# Patient Record
Sex: Male | Born: 2000 | Hispanic: No | Marital: Single | Smoking: Never smoker
Health system: Southern US, Community
[De-identification: ages and names within clinical notes are randomized; demographics above are authoritative.]

---

## 2018-09-29 ENCOUNTER — Encounter (HOSPITAL_COMMUNITY): Payer: Self-pay

## 2018-09-29 ENCOUNTER — Ambulatory Visit (INDEPENDENT_AMBULATORY_CARE_PROVIDER_SITE_OTHER): Payer: Self-pay

## 2018-09-29 ENCOUNTER — Ambulatory Visit (HOSPITAL_COMMUNITY)
Admission: EM | Admit: 2018-09-29 | Discharge: 2018-09-29 | Disposition: A | Discharge status: Home / Self Care | Payer: Self-pay | Attending: Family Medicine | Admitting: Family Medicine

## 2018-09-29 DIAGNOSIS — R0789 Other chest pain: Secondary | ICD-10-CM | POA: Insufficient documentation

## 2018-09-29 DIAGNOSIS — S20212A Contusion of left front wall of thorax, initial encounter: Secondary | ICD-10-CM | POA: Insufficient documentation

## 2018-09-29 DIAGNOSIS — R52 Pain, unspecified: Secondary | ICD-10-CM | POA: Insufficient documentation

## 2018-09-29 DIAGNOSIS — S40022A Contusion of left upper arm, initial encounter: Secondary | ICD-10-CM | POA: Insufficient documentation

## 2018-09-29 MED ORDER — IBUPROFEN 800 MG PO TABS: 800.0000 mg | ORAL_TABLET | Freq: Three times a day (TID) | ORAL | 0 refills | Status: DC

## 2018-09-29 MED ORDER — HYDROCODONE-ACETAMINOPHEN 5-325 MG PO TABS
ORAL_TABLET | ORAL | Status: AC
  Filled 2018-09-29: qty 1

## 2018-09-29 MED ORDER — HYDROCODONE-ACETAMINOPHEN 7.5-325 MG PO TABS: 1.0000 | ORAL_TABLET | Freq: Four times a day (QID) | ORAL | 0 refills | Status: AC | PRN

## 2018-09-29 MED ORDER — HYDROCODONE-ACETAMINOPHEN 5-325 MG PO TABS
1.0000 | ORAL_TABLET | Freq: Once | ORAL | Status: AC
  Administered 2018-09-29: 1 via ORAL

## 2018-09-29 NOTE — Discharge Instructions (Addendum)
Rest Use ice to painful areas Take ibuprofen 3 x a day with food If pain continues severe, may add a hydrocodone in addition Do not drive on hydrocodone Expect improvement over a couple of weeks

## 2018-09-29 NOTE — ED Provider Notes (Signed)
[facility]    CSN: [id number] Arrival date & time: 09/29/18  1347     History   Chief Complaint Chief Complaint  Patient presents with  . Fall    HPI James Callahan is a 17 y.o. male.   HPI  Larey SeatFell off of a roof onto the left ribs and left arm with acute pain.  Feels unable to take a deep breath.  Hurts in the left lower ribs below the left breast.  No bruising.  No bleeding.  No hemoptysis.   Not hit his head.  No loss of consciousness. Denies any pain in her neck and back.  History reviewed. No pertinent past medical history.  There are no active problems to display for this patient.   History reviewed. No pertinent surgical history.     Home Medications    Prior to Admission medications   Medication Sig Start Date End Date Taking? Authorizing Provider  HYDROcodone-acetaminophen (NORCO) 7.5-325 MG tablet Take 1 tablet by mouth every 6 (six) hours as needed for moderate pain. 09/29/18   Eustace MooreNelson, Jackelynn Hosie Sue, MD  ibuprofen (ADVIL,MOTRIN) 800 MG tablet Take 1 tablet (800 mg total) by mouth 3 (three) times daily. 09/29/18   Eustace MooreNelson, Adyn Serna Sue, MD    Family History Family History  Problem Relation Age of Onset  . Healthy Mother   . Heart attack Father     Social History Social History   Tobacco Use  . Smoking status: Not on file  Substance Use Topics  . Alcohol use: Not on file  . Drug use: Not on file     Allergies   Patient has no known allergies.   Review of Systems Review of Systems  Constitutional: Negative for chills and fever.  HENT: Negative for ear pain and sore throat.   Eyes: Negative for pain and visual disturbance.  Respiratory: Positive for shortness of breath. Negative for cough.   Cardiovascular: Positive for chest pain. Negative for palpitations.  Gastrointestinal: Negative for abdominal pain and vomiting.  Genitourinary: Negative for dysuria and hematuria.  Musculoskeletal: Positive for arthralgias. Negative for back  pain.  Skin: Negative for color change and rash.  Neurological: Negative for seizures and syncope.  All other systems reviewed and are negative.    Physical Exam Triage Vital Signs ED Triage Vitals  Enc Vitals Group     BP 09/29/18 1412 (!) 144/78     Pulse Rate 09/29/18 1412 78     Resp 09/29/18 1412 20     Temp 09/29/18 1412 98.7 F (37.1 C)     Temp src --      SpO2 09/29/18 1412 99 %     Weight --      Height --      Head Circumference --      Peak Flow --      Pain Score 09/29/18 1413 8     Pain Loc --      Pain Edu? --      Excl. in GC? --    No data found.  Updated Vital Signs BP (!) 144/78   Pulse 78   Temp 98.7 F (37.1 C)   Resp 20   SpO2 99%      Physical Exam Constitutional:      General: He is in acute distress.     Appearance: He is well-developed and normal weight.     Comments: In obvious discomfort  HENT:     Head: Normocephalic and atraumatic.  Right Ear: Tympanic membrane and ear canal normal.     Left Ear: Tympanic membrane and ear canal normal.     Mouth/Throat:     Mouth: Mucous membranes are moist.  Eyes:     Conjunctiva/sclera: Conjunctivae normal.     Pupils: Pupils are equal, round, and reactive to light.  Neck:     Musculoskeletal: Normal range of motion and neck supple.  Cardiovascular:     Rate and Rhythm: Normal rate and regular rhythm.     Heart sounds: Normal heart sounds.  Pulmonary:     Effort: Pulmonary effort is normal. No respiratory distress.     Breath sounds: Normal breath sounds.  Chest:     Chest wall: Tenderness present.    Abdominal:     General: Abdomen is flat. There is no distension.     Palpations: Abdomen is soft.  Musculoskeletal: Normal range of motion.        General: Tenderness present.       Arms:  Skin:    General: Skin is warm and dry.  Neurological:     General: No focal deficit present.     Mental Status: He is alert. Mental status is at baseline.  Psychiatric:        Mood and  Affect: Mood normal.        Thought Content: Thought content normal.      UC Treatments / Results  Labs (all labs ordered are listed, but only abnormal results are displayed) Labs Reviewed - No data to display  EKG None  Radiology Dg Ribs Unilateral W/chest Left  Result Date: 09/29/2018 CLINICAL DATA:  Pain following fall EXAM: LEFT RIBS AND CHEST - 3+ VIEW COMPARISON:  None. FINDINGS: Frontal chest as well as oblique and cone-down rib images were obtained. Lungs are clear. Heart size and pulmonary vascularity are normal. No adenopathy. No rib fracture evident.  No pneumothorax or pleural effusion. IMPRESSION: No evident rib fracture.  No pneumothorax.  Lungs clear. Electronically Signed   By: Bretta Bang III M.D.   On: 09/29/2018 14:42   Dg Forearm Left  Result Date: 09/29/2018 CLINICAL DATA:  Left arm pain after falling off of a roof. Initial encounter. EXAM: LEFT FOREARM - 2 VIEW COMPARISON:  None. FINDINGS: There is no evidence of fracture or other focal bone lesions. Soft tissues are unremarkable. IMPRESSION: Negative. Electronically Signed   By: Sebastian Ache M.D.   On: 09/29/2018 15:02    Procedures Procedures (including critical care time)  Medications Ordered in UC Medications  HYDROcodone-acetaminophen (NORCO/VICODIN) 5-325 MG per tablet 1 tablet (1 tablet Oral Given 09/29/18 1417)    Initial Impression / Assessment and Plan / UC Course  I have reviewed the triage vital signs and the nursing notes.  Pertinent labs & imaging results that were available during my care of the patient were reviewed by me and considered in my medical decision making (see chart for details).     No fractures.  Discussed contusions.  Discussed medications.  We talked about deep breathing exercises to prevent pneumonia.  Follow-up with PCP.  Come here if worse instead of better at any time Final Clinical Impressions(s) / UC Diagnoses   Final diagnoses:  Pain  Contusion of left  arm, initial encounter  Contusion of ribs, left, initial encounter  Acute chest wall pain     Discharge Instructions     Rest Use ice to painful areas Take ibuprofen 3 x a day with food If pain  continues severe, may add a hydrocodone in addition Do not drive on hydrocodone Expect improvement over a couple of weeks    ED Prescriptions    Medication Sig Dispense Auth. Provider   HYDROcodone-acetaminophen (NORCO) 7.5-325 MG tablet Take 1 tablet by mouth every 6 (six) hours as needed for moderate pain. 15 tablet Eustace MooreNelson, Shalin Linders Sue, MD   ibuprofen (ADVIL,MOTRIN) 800 MG tablet Take 1 tablet (800 mg total) by mouth 3 (three) times daily. 21 tablet Eustace MooreNelson, Malajah Oceguera Sue, MD     Controlled Substance Prescriptions [place] Controlled Substance Registry consulted? Yes, I have consulted the [place] Controlled Substances Registry for this patient, and feel the risk/benefit ratio today is favorable for proceeding with this prescription for a controlled substance.   Eustace MooreNelson, Shavy Beachem Sue, MD 09/29/18 [phone]

## 2018-09-29 NOTE — ED Triage Notes (Signed)
Pt presents with complaints of falling off a roof 7-8 feet. Denies head trauma or LOC. Complains of pain on the left rib cage and in his left arm. Dr. Delton SeeNelson at bedside in triage to assess patient. Patient reports pain with deep breath and feeling like he cant take a deep breath.

## 2018-09-29 NOTE — ED Notes (Signed)
Arm Sling provided by Orland Jarredheres Staley, CMA

## 2020-04-29 IMAGING — DX DG FOREARM 2V*L*
3 series · 3 of 3 positions shown · non-contrast
Comparison: None.

CLINICAL DATA: Left arm pain after falling off of a roof. Initial
encounter.

EXAM:
LEFT FOREARM - 2 VIEW

[forearm ap]
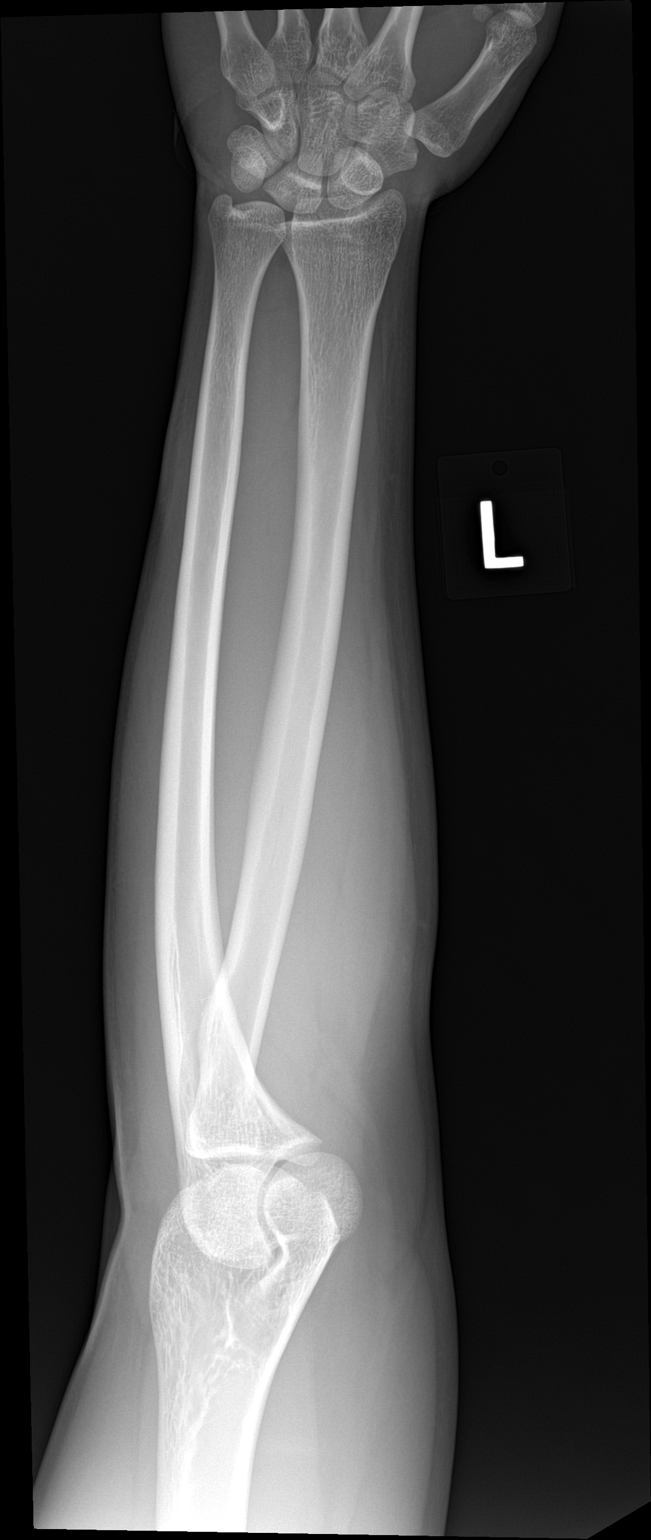

[forearm lat (1 of 2)]
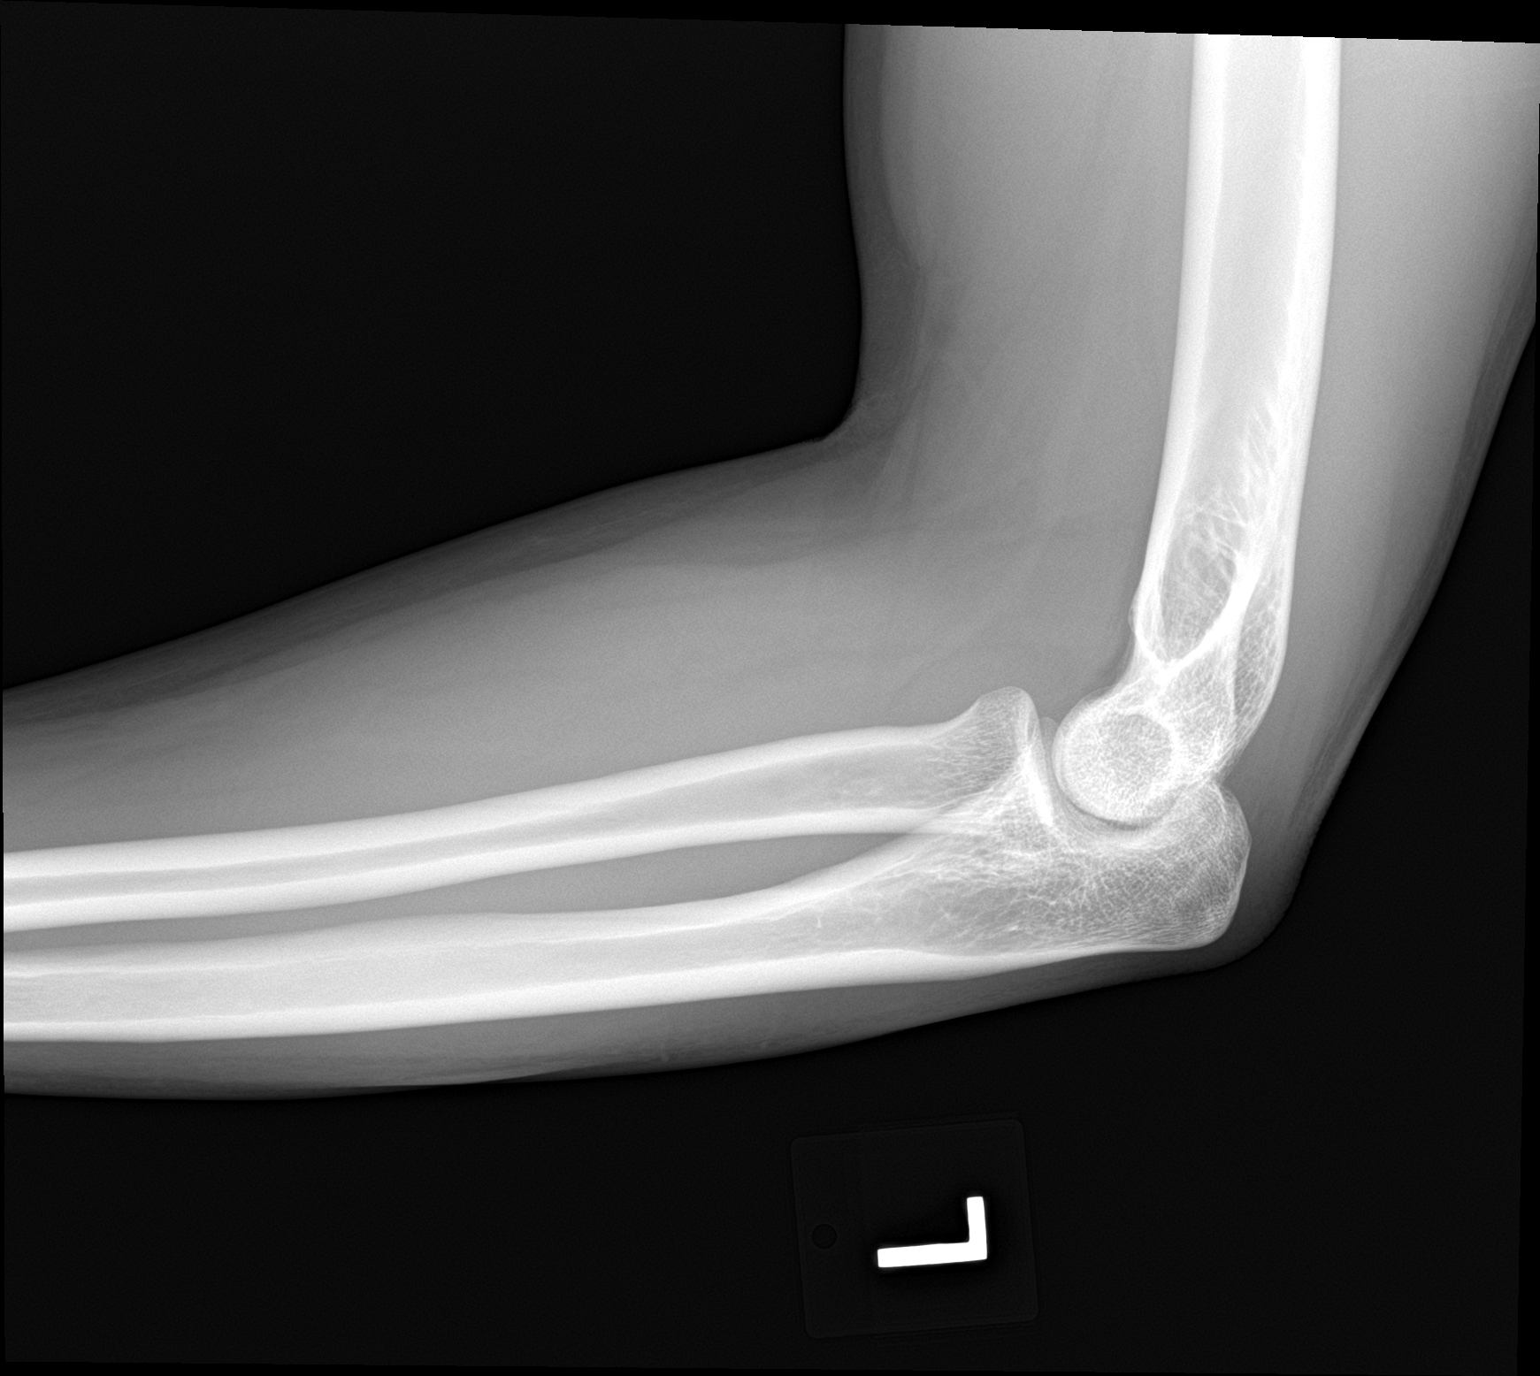

[forearm lat (2 of 2)]
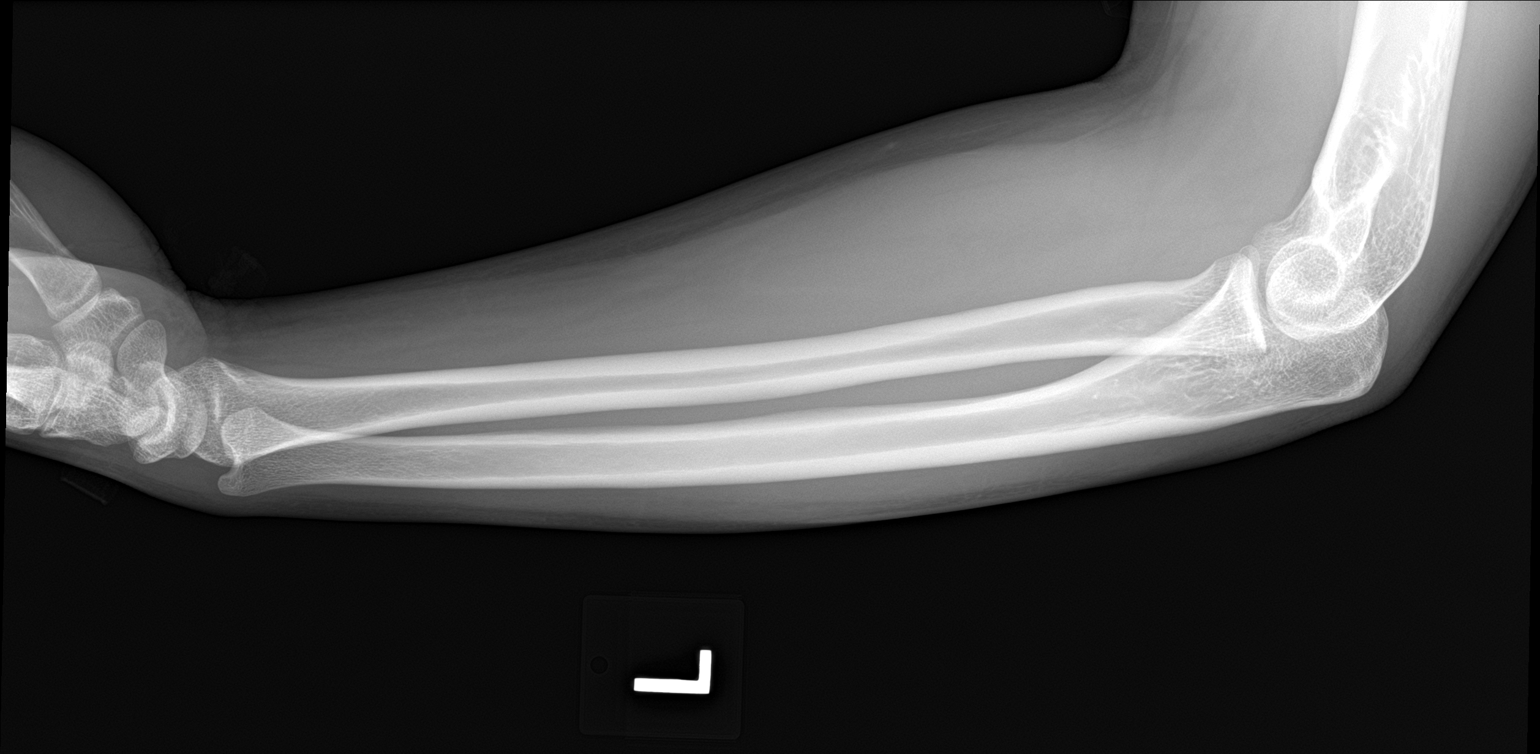

[3 of 3 positions shown; findings below may reference images not displayed]

FINDINGS: There is no evidence of fracture or other focal bone lesions. Soft
tissues are unremarkable.
IMPRESSION: Negative.

## 2020-04-29 IMAGING — DX DG RIBS W/ CHEST 3+V*L*
3 series · 3 of 3 positions shown · non-contrast
Comparison: None.

CLINICAL DATA: Pain following fall

EXAM:
LEFT RIBS AND CHEST - 3+ VIEW

[chest pa]
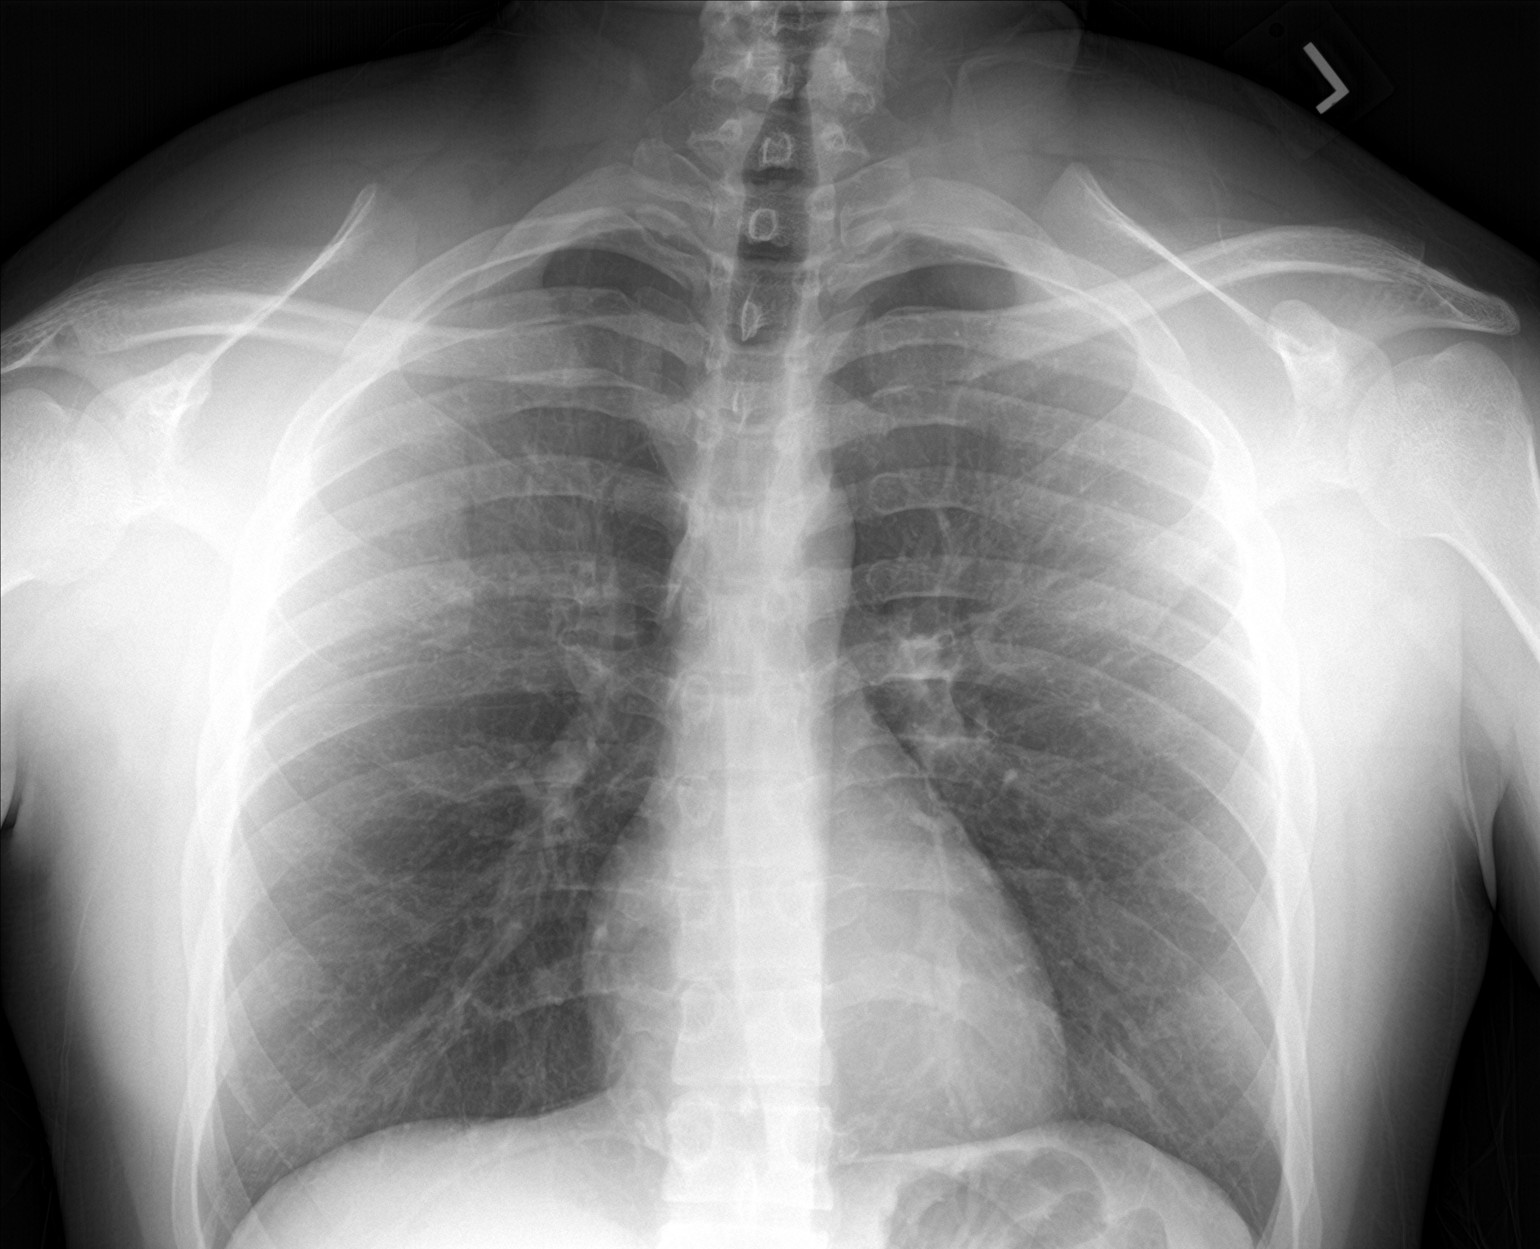

[rib obl]
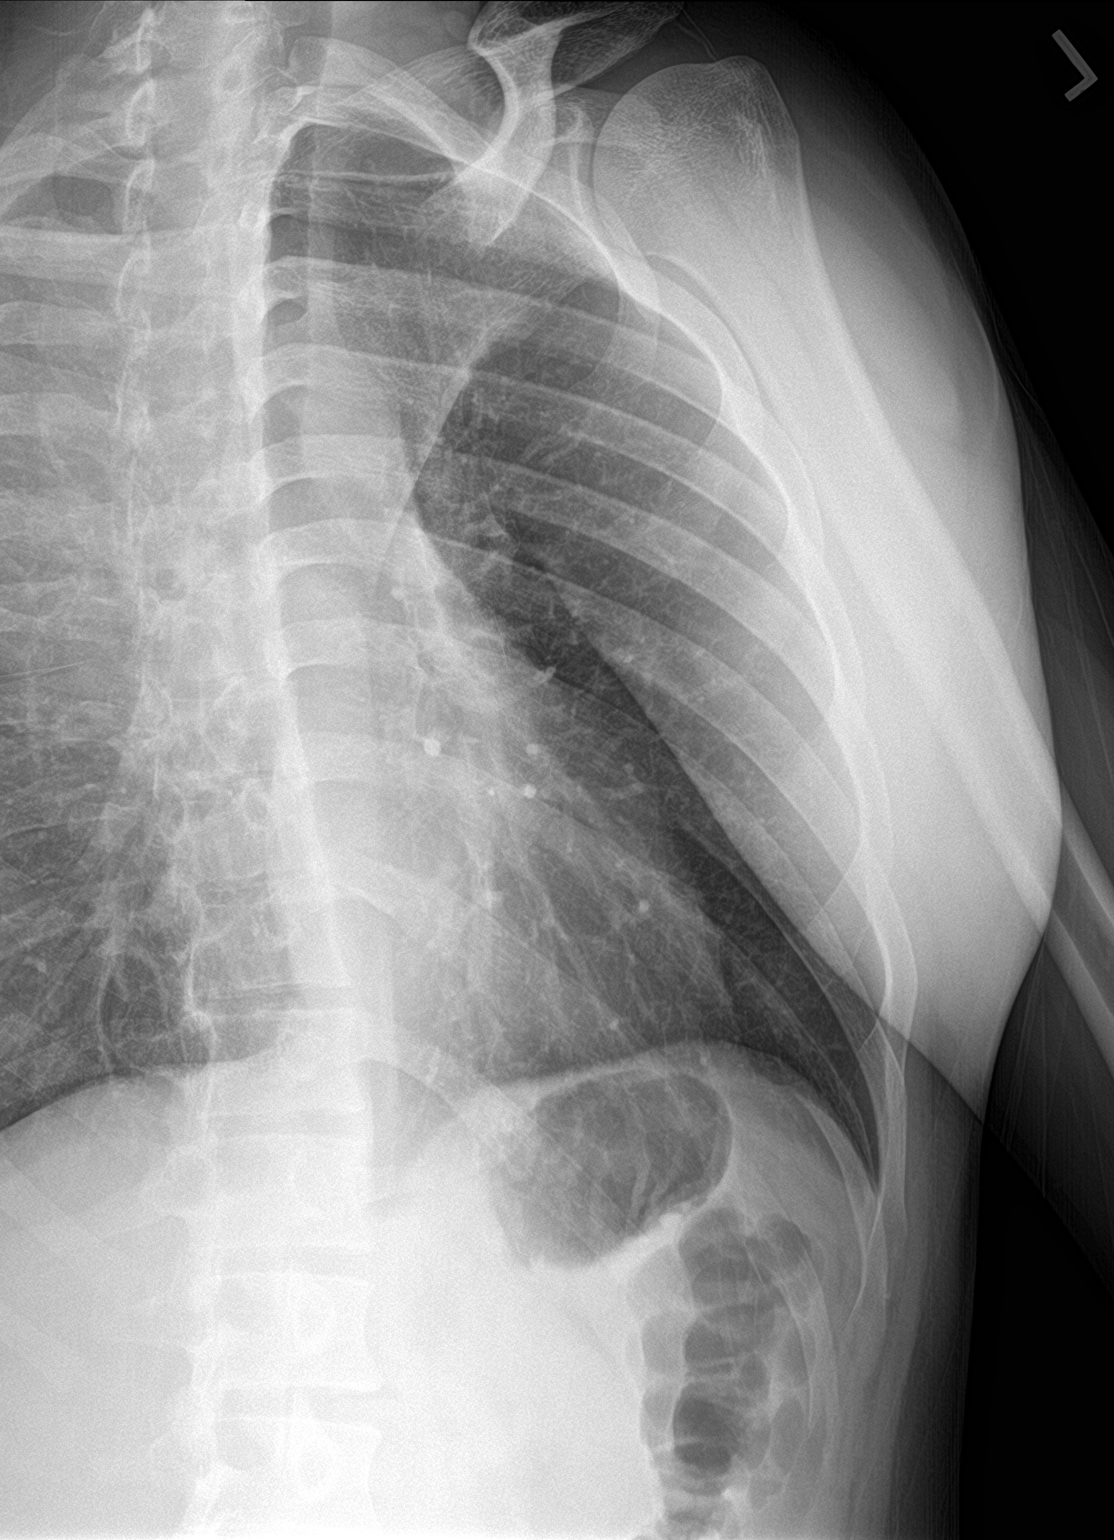

[rib pa]
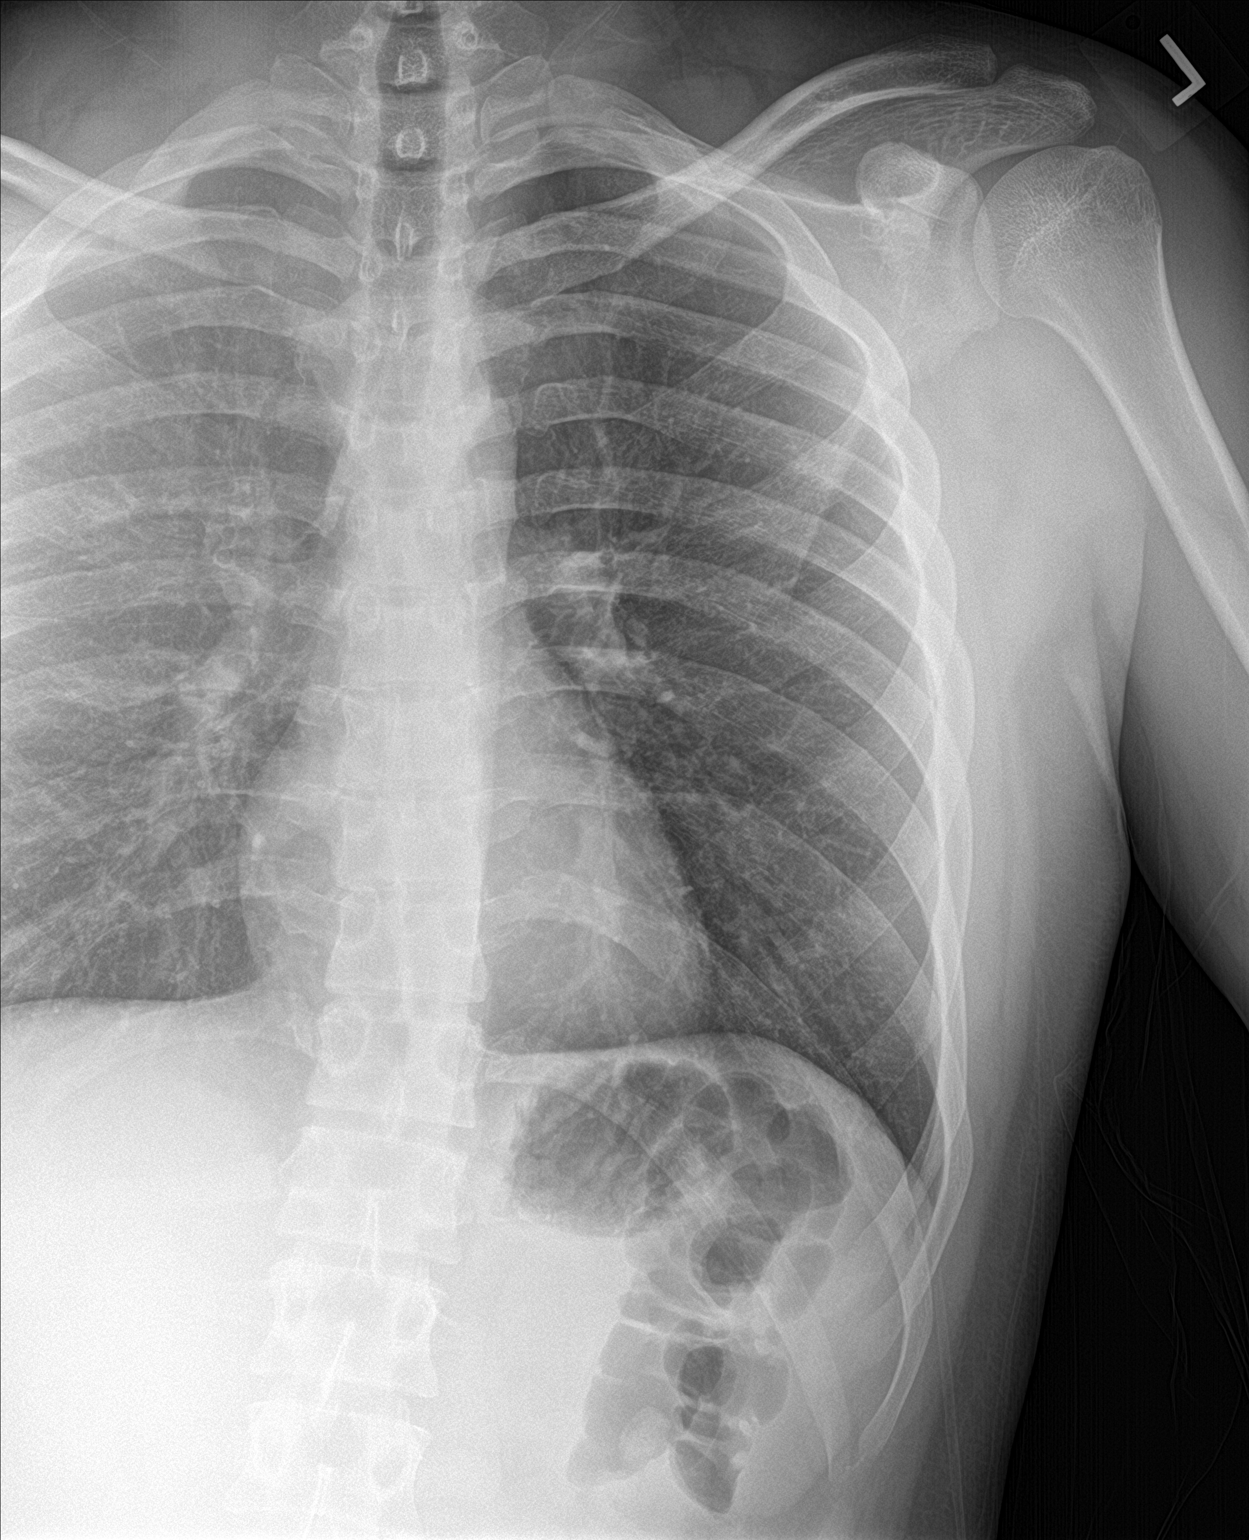

[3 of 3 positions shown; findings below may reference images not displayed]

FINDINGS: Frontal chest as well as oblique and cone-down rib images were
obtained. Lungs are clear. Heart size and pulmonary vascularity are
normal. No adenopathy.

No rib fracture evident.  No pneumothorax or pleural effusion.
IMPRESSION: No evident rib fracture.  No pneumothorax.  Lungs clear.

## 2023-10-05 ENCOUNTER — Emergency Department
Admission: EM | Admit: 2023-10-05 | Discharge: 2023-10-05 | Discharge status: Against Medical Advice | Payer: Medicaid Other | Attending: Emergency Medicine | Admitting: Emergency Medicine

## 2023-10-05 ENCOUNTER — Other Ambulatory Visit: Payer: Self-pay

## 2023-10-05 DIAGNOSIS — Z5321 Procedure and treatment not carried out due to patient leaving prior to being seen by health care provider: Secondary | ICD-10-CM | POA: Insufficient documentation

## 2023-10-05 DIAGNOSIS — R519 Headache, unspecified: Secondary | ICD-10-CM | POA: Insufficient documentation

## 2023-10-05 NOTE — ED Triage Notes (Signed)
Pt sts that he has been having a headache since 2100 last night. Pt sts that he has felt the pain in the left side of his head since then and nothing is helping the pain.

## 2023-10-05 NOTE — ED Notes (Signed)
Pt called for third time. Pt not in lobby, outside nor restroom. Pt has now been called 3 times and no answer. Pt taken off board due to this.

## 2024-02-20 ENCOUNTER — Emergency Department: Admission: EM | Admit: 2024-02-20 | Discharge: 2024-02-20 | Disposition: A | Discharge status: Home / Self Care

## 2024-02-20 ENCOUNTER — Other Ambulatory Visit: Payer: Self-pay

## 2024-02-20 DIAGNOSIS — J029 Acute pharyngitis, unspecified: Secondary | ICD-10-CM | POA: Insufficient documentation

## 2024-02-20 DIAGNOSIS — R059 Cough, unspecified: Secondary | ICD-10-CM | POA: Diagnosis not present

## 2024-02-20 LAB — RESP PANEL BY RT-PCR (RSV, FLU A&B, COVID)  RVPGX2
Influenza A by PCR: NEGATIVE
Influenza B by PCR: NEGATIVE
Resp Syncytial Virus by PCR: NEGATIVE
SARS Coronavirus 2 by RT PCR: NEGATIVE

## 2024-02-20 LAB — GROUP A STREP BY PCR: Group A Strep by PCR: NOT DETECTED

## 2024-02-20 MED ORDER — ACETAMINOPHEN 500 MG PO TABS
1000.0000 mg | ORAL_TABLET | Freq: Once | ORAL | Status: AC
  Administered 2024-02-20: 1000 mg via ORAL
  Filled 2024-02-20: qty 2

## 2024-02-20 MED ORDER — IBUPROFEN 800 MG PO TABS
800.0000 mg | ORAL_TABLET | Freq: Once | ORAL | Status: AC
  Administered 2024-02-20: 800 mg via ORAL
  Filled 2024-02-20: qty 1

## 2024-02-20 MED ORDER — DEXAMETHASONE 4 MG PO TABS
10.0000 mg | ORAL_TABLET | Freq: Once | ORAL | Status: AC
  Administered 2024-02-20: 10 mg via ORAL
  Filled 2024-02-20: qty 3

## 2024-02-20 MED ORDER — LIDOCAINE VISCOUS HCL 2 % MT SOLN
15.0000 mL | Freq: Once | OROMUCOSAL | Status: AC
  Administered 2024-02-20: 15 mL via OROMUCOSAL
  Filled 2024-02-20: qty 15

## 2024-02-20 MED ORDER — AMOXICILLIN-POT CLAVULANATE 875-125 MG PO TABS: 1.0000 | ORAL_TABLET | Freq: Two times a day (BID) | ORAL | 0 refills | Status: AC

## 2024-02-20 MED ORDER — IBUPROFEN 800 MG PO TABS: 800.0000 mg | ORAL_TABLET | Freq: Three times a day (TID) | ORAL | 0 refills | Status: AC | PRN

## 2024-02-20 NOTE — ED Provider Notes (Signed)
 [facility] Provider Note    Event Date/Time   First MD Initiated Contact with Patient 02/20/24 1408     (approximate)   History   Sore Throat and Cough  Pt to ED for sore throat since 1 week. States inside of throat is swollen, painful to swallow. Also dry mouth, dry cough and congestion. States "it feels like I have a piece of glass or metal stuck in my throat". Pt in NAD, has wet cough.    HPI James Callahan is a 23 y.o. male no significant PMH presents for evaluation of sore throat - Present for 1 week, worsening, refractory to Tylenol  -Tolerating p.o., no voice change -No fever, cough. + Nasal congestion.     Physical Exam   Triage Vital Signs: ED Triage Vitals  Encounter Vitals Group     BP 02/20/24 1401 (!) 113/92     Systolic BP Percentile --      Diastolic BP Percentile --      Pulse Rate 02/20/24 1401 (!) 102     Resp 02/20/24 1401 20     Temp 02/20/24 1401 98.6 F (37 C)     Temp Source 02/20/24 1401 Oral     SpO2 02/20/24 1401 98 %     Weight 02/20/24 1357 265 lb (120.2 kg)     Height 02/20/24 1357 5\' 10"  (1.778 m)     Head Circumference --      Peak Flow --      Pain Score 02/20/24 1357 8     Pain Loc --      Pain Education --      Exclude from Growth Chart --     Most recent vital signs: Vitals:   02/20/24 1401  BP: (!) 113/92  Pulse: (!) 102  Resp: 20  Temp: 98.6 F (37 C)  SpO2: 98%     General: Awake, no distress.  Well-appearing. HEENT: + Bilateral tonsillar erythema and mild swelling, no exudate.  No uvular deviation.  No trismus or pooling of secretions.  Mild bilateral submandibular lymphadenopathy. CV:  Good peripheral perfusion. Resp:  Normal effort.  Abd:  No distention. Nontender to deep palpation throughout   ED Results / Procedures / Treatments   Labs (all labs ordered are listed, but only abnormal results are displayed) Labs Reviewed  GROUP A STREP BY PCR  RESP PANEL BY RT-PCR (RSV, FLU  A&B, COVID)  [id number]     EKG  N/a   RADIOLOGY N/a   PROCEDURES:  Critical Care performed: No  Procedures   MEDICATIONS ORDERED IN ED: Medications  acetaminophen  (TYLENOL ) tablet 1,000 mg (has no administration in time range)  ibuprofen  (ADVIL ) tablet 800 mg (has no administration in time range)  dexamethasone (DECADRON) tablet 10 mg (has no administration in time range)  lidocaine (XYLOCAINE) 2 % viscous mouth solution 15 mL (has no administration in time range)     IMPRESSION / MDM / ASSESSMENT AND PLAN / ED COURSE  I reviewed the triage vital signs and the nursing notes.                              DDX/MDM/AP: Differential diagnosis includes, but is not limited to, pharyngitis, consider viral versus bacterial though given timeframe of symptoms and worsening I am inclined to treat this as a bacterial pharyngitis.  No evidence of deep neck space infection at this time nor PTA.  Plan for pain  control, Augmentin.  Plan: - Tylenol , Motrin , viscous lidocaine, Decadron - Augmentin - Viral swab and strep swab collected at triage, can follow-up as outpatient--treating empirically with antibiotics  Patient's presentation is most consistent with acute, uncomplicated illness.        FINAL CLINICAL IMPRESSION(S) / ED DIAGNOSES   Final diagnoses:  Acute pharyngitis, unspecified etiology     Rx / DC Orders   ED Discharge Orders          Ordered    ibuprofen  (ADVIL ) 800 MG tablet  Every 8 hours PRN        02/20/24 1431    amoxicillin-clavulanate (AUGMENTIN) 875-125 MG tablet  2 times daily        02/20/24 1431             Note:  This document was prepared using Dragon voice recognition software and may include unintentional dictation errors.   Collis Deaner, MD 02/20/24 [phone]

## 2024-02-20 NOTE — Discharge Instructions (Addendum)
 Your evaluation in the emergency department was overall reassuring.  You may be developing a bacterial infection of your throat, and I have started you on an antibiotic to treat this.  You can continue to use Tylenol , and have also prescribed you Motrin  to help with any ongoing pain.  Please follow-up with your primary care doctor as needed for any ongoing symptoms, and return to the emergency department with any new or worsening symptoms.

## 2024-02-20 NOTE — ED Triage Notes (Signed)
 Pt to ED for sore throat since 1 week. States inside of throat is swollen, painful to swallow. Also dry mouth, dry cough and congestion. States "it feels like I have a piece of glass or metal stuck in my throat". Pt in NAD, has wet cough.

## 2024-07-20 ENCOUNTER — Emergency Department

## 2024-07-20 ENCOUNTER — Emergency Department
Admission: EM | Admit: 2024-07-20 | Discharge: 2024-07-20 | Disposition: A | Discharge status: Home / Self Care | Attending: Emergency Medicine | Admitting: Emergency Medicine

## 2024-07-20 ENCOUNTER — Other Ambulatory Visit: Payer: Self-pay

## 2024-07-20 ENCOUNTER — Encounter: Payer: Self-pay | Admitting: Emergency Medicine

## 2024-07-20 DIAGNOSIS — W19XXXA Unspecified fall, initial encounter: Secondary | ICD-10-CM | POA: Insufficient documentation

## 2024-07-20 DIAGNOSIS — S9002XA Contusion of left ankle, initial encounter: Secondary | ICD-10-CM | POA: Insufficient documentation

## 2024-07-20 DIAGNOSIS — S99912A Unspecified injury of left ankle, initial encounter: Secondary | ICD-10-CM

## 2024-07-20 LAB — CBC WITH DIFFERENTIAL/PLATELET
Abs Immature Granulocytes: 0.05 K/uL (ref 0.00–0.07)
Basophils Absolute: 0.1 K/uL (ref 0.0–0.1)
Basophils Relative: 1 %
Eosinophils Absolute: 0.4 K/uL (ref 0.0–0.5)
Eosinophils Relative: 4 %
HCT: 44.1 % (ref 39.0–52.0)
Hemoglobin: 15.1 g/dL (ref 13.0–17.0)
Immature Granulocytes: 1 %
Lymphocytes Relative: 28 %
Lymphs Abs: 2.6 K/uL (ref 0.7–4.0)
MCH: 30.4 pg (ref 26.0–34.0)
MCHC: 34.2 g/dL (ref 30.0–36.0)
MCV: 88.7 fL (ref 80.0–100.0)
Monocytes Absolute: 0.6 K/uL (ref 0.1–1.0)
Monocytes Relative: 6 %
Neutro Abs: 5.6 K/uL (ref 1.7–7.7)
Neutrophils Relative %: 60 %
Platelets: 277 K/uL (ref 150–400)
RBC: 4.97 MIL/uL (ref 4.22–5.81)
RDW: 12.2 % (ref 11.5–15.5)
WBC: 9.3 K/uL (ref 4.0–10.5)
nRBC: 0 % (ref 0.0–0.2)

## 2024-07-20 LAB — BASIC METABOLIC PANEL WITH GFR
Anion gap: 9 (ref 5–15)
BUN: 6 mg/dL (ref 6–20)
CO2: 26 mmol/L (ref 22–32)
Calcium: 9.1 mg/dL (ref 8.9–10.3)
Chloride: 103 mmol/L (ref 98–111)
Creatinine, Ser: 0.95 mg/dL (ref 0.61–1.24)
GFR, Estimated: >60 mL/min (ref >60)
Glucose, Bld: 110 mg/dL — ABNORMAL HIGH (ref 70–99)
Potassium: 3.9 mmol/L (ref 3.5–5.1)
Sodium: 138 mmol/L (ref 135–145)

## 2024-07-20 MED ORDER — KETOROLAC TROMETHAMINE 15 MG/ML IJ SOLN
15.0000 mg | Freq: Once | INTRAMUSCULAR | Status: AC
  Administered 2024-07-20: 15 mg via INTRAMUSCULAR
  Filled 2024-07-20: qty 1

## 2024-07-20 MED ORDER — ACETAMINOPHEN 325 MG PO TABS
650.0000 mg | ORAL_TABLET | Freq: Once | ORAL | Status: DC
  Filled 2024-07-20 (×2): qty 2

## 2024-07-20 NOTE — ED Notes (Signed)
 Patient declined discharge vital signs.

## 2024-07-20 NOTE — ED Provider Notes (Signed)
 [facility] Provider Note    None    (approximate)   History   Leg Pain   HPI  James Callahan is a 23 y.o. male who presents today for evaluation of left ankle pain.  Patient reports that he noticed bruising to both sides of his ankle prompting him to come in for evaluation.  He denies paresthesias or weakness.  He is uncertain of any particular injury, but does report that he fell through the vent a few days ago.  He is able to ambulate.  There are no active problems to display for this patient.         Physical Exam   Triage Vital Signs: ED Triage Vitals  Encounter Vitals Group     BP 07/20/24 0912 (!) 150/86     Girls Systolic BP Percentile --      Girls Diastolic BP Percentile --      Boys Systolic BP Percentile --      Boys Diastolic BP Percentile --      Pulse Rate 07/20/24 0912 (!) 102     Resp 07/20/24 0912 16     Temp 07/20/24 0912 98.6 F (37 C)     Temp Source 07/20/24 0912 Oral     SpO2 07/20/24 0912 96 %     Weight 07/20/24 0912 280 lb (127 kg)     Height 07/20/24 0912 5' 10 (1.778 m)     Head Circumference --      Peak Flow --      Pain Score 07/20/24 0911 8     Pain Loc --      Pain Education --      Exclude from Growth Chart --     Most recent vital signs: Vitals:   07/20/24 1130 07/20/24 1145  BP: 124/76   Pulse: 72 75  Resp:    Temp:    SpO2: 97% 100%    Physical Exam Vitals and nursing note reviewed.  Constitutional:      General: Awake and alert. No acute distress.    Appearance: Normal appearance. The patient is normal weight.  HENT:     Head: Normocephalic and atraumatic.     Mouth: Mucous membranes are moist.  Eyes:     General: PERRL. Normal EOMs        Right eye: No discharge.        Left eye: No discharge.     Conjunctiva/sclera: Conjunctivae normal.  Cardiovascular:     Rate and Rhythm: Normal rate and regular rhythm.     Pulses: Normal pulses.  Pulmonary:     Effort: Pulmonary effort  is normal. No respiratory distress.     Breath sounds: Normal breath sounds.  Abdominal:     Abdomen is soft. There is no abdominal tenderness. No rebound or guarding. No distention. Musculoskeletal:        General: No swelling. Normal range of motion.     Cervical back: Normal range of motion and neck supple.  Left ankle: Tenderness and very mild swelling over the lateral ankle without specific lateral or medial malleolar tenderness or proximal fifth metacarpal tenderness.  Mild proximal fibular tenderness. 2+ pedal pulses with brisk capillary refill. Intact distal sensation and strength with normal ROM. Able to plantar flex and dorsiflex against resistance. Able to invert and evert against resistance. Negative dorsiflexion external rotation test.  Positive squeeze test. Negative Thompson test.  No pitting edema.  Normal range of motion of knee and  hip. Skin:    General: Skin is warm and dry.     Capillary Refill: Capillary refill takes less than 2 seconds.     Findings: No rash.  Neurological:     Mental Status: The patient is awake and alert.      ED Results / Procedures / Treatments   Labs (all labs ordered are listed, but only abnormal results are displayed) Labs Reviewed  BASIC METABOLIC PANEL WITH GFR - Abnormal; Notable for the following components:      Result Value   Glucose, Bld 110 (*)    All other components within normal limits  CBC WITH DIFFERENTIAL/PLATELET     EKG     RADIOLOGY I independently reviewed and interpreted imaging and agree with radiologists findings.     PROCEDURES:  Critical Care performed:   Procedures   MEDICATIONS ORDERED IN ED: Medications  acetaminophen  (TYLENOL ) tablet 650 mg (650 mg Oral Not Given 07/20/24 1024)  ketorolac (TORADOL) 15 MG/ML injection 15 mg (15 mg Intramuscular Given 07/20/24 1044)     IMPRESSION / MDM / ASSESSMENT AND PLAN / ED COURSE  I reviewed the triage vital signs and the nursing  notes.   Differential diagnosis includes, but is not limited to, sprain sprain, fracture, DVT.  Patient is awake and alert, hemodynamically stable and afebrile.  He has faint ecchymosis noted over the medial and lateral aspect of his ankle with very mild swelling.  He has normal 2+ pedal pulses bilaterally, both PT and DP.  There is no swelling besides over the lateral malleolus.  He is able to plantarflex and dorsiflex against resistance.  He has faint ecchymosis also noted over the distal tib-fib.  Sensation is intact to light touch throughout, and compartments are soft compressible throughout.  There are no open wounds.  Patient does have positive squeeze test, and with the ecchymosis over the distal tib-fib it is possible that he has a high ankle sprain.  He does not seem to be very forthcoming with his mechanism of injury which I explained to him is making it quite difficult to diagnose him properly.  However, there is no evidence of infection.  There is no crepitus.  There is no erythema.  There are no bullae or skin sloughing noted.  There is no pain out of proportion.  He has normal capillary refill, normal pulses, normal sensation, I do not suspect acute arterial occlusion, and his foot is warm and well-perfused throughout.  X-rays of his foot, ankle, and tib-fib are negative for any acute osseous injury.  There is no pitting edema throughout his leg, however patient was worried about DVT, therefore ultrasound obtained and this was negative for DVT.  Labs are reassuring.  Patient was placed in a ankle splint and instructed to follow-up with podiatry.  We discussed return precautions in the meantime.  He is able to ambulate.  Patient understands and agrees with plan.  He was discharged in stable condition.   Patient's presentation is most consistent with acute complicated illness / injury requiring diagnostic workup.   Clinical Course as of 07/20/24 1441  Thu Jul 20, 2024  1031 Reevaluated  patient, he is now complaining of tenderness over the proximal fibula so we will also obtain left tib-fib x-ray.  There is no swelling.  He does have faint ecchymosis. [JP]    Clinical Course User Index [JP] Tanazia Achee E, PA-C     FINAL CLINICAL IMPRESSION(S) / ED DIAGNOSES   Final diagnoses:  Injury  of left ankle, initial encounter     Rx / DC Orders   ED Discharge Orders     None        Note:  This document was prepared using Dragon voice recognition software and may include unintentional dictation errors.   Amya Hlad E, PA-C 07/20/24 1441    Viviann Pastor, MD 07/21/24 [phone]

## 2024-07-20 NOTE — Discharge Instructions (Addendum)
 Please follow-up with podiatry.  Please rest, ice, elevate your foot.  Please return for any new, worsening, or changing symptoms or other concerns.  It was a pleasure caring for you today.

## 2024-07-20 NOTE — ED Notes (Signed)
 Ultrasound is out of room. Wife and daughter remain at bedside.
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
